# Patient Record
Sex: Male | Born: 1985 | Race: White | Hispanic: No | Marital: Married | Smoking: Former smoker
Health system: Southern US, Community
[De-identification: ages and names within clinical notes are randomized; demographics above are authoritative.]

---

## 2011-08-30 ENCOUNTER — Emergency Department: Payer: Self-pay | Admitting: Emergency Medicine

## 2012-02-26 ENCOUNTER — Emergency Department: Payer: Self-pay | Admitting: Emergency Medicine

## 2014-09-11 ENCOUNTER — Ambulatory Visit
Admission: EM | Admit: 2014-09-11 | Discharge: 2014-09-11 | Disposition: A | Discharge status: Home / Self Care | Payer: PRIVATE HEALTH INSURANCE | Attending: Family Medicine | Admitting: Family Medicine

## 2014-09-11 ENCOUNTER — Encounter: Payer: Self-pay | Admitting: Emergency Medicine

## 2014-09-11 DIAGNOSIS — S0101XA Laceration without foreign body of scalp, initial encounter: Secondary | ICD-10-CM | POA: Diagnosis not present

## 2014-09-11 MED ORDER — TETANUS-DIPHTH-ACELL PERTUSSIS 5-2.5-18.5 LF-MCG/0.5 IM SUSP
0.5000 mL | Freq: Once | INTRAMUSCULAR | Status: AC
  Administered 2014-09-11: 0.5 mL via INTRAMUSCULAR

## 2014-09-11 NOTE — ED Provider Notes (Addendum)
CSN: [id number]     Arrival date & time 09/11/14  1550 History   First MD Initiated Contact with Patient 09/11/14 1634     Chief Complaint  Patient presents with  . Laceration   (Consider location/radiation/quality/duration/timing/severity/associated sxs/prior Treatment) HPI Comments: Patient presents with a scalp laceration to left side of scalp. States sustained with the edge of a large printer. Denies any loss of consciousness, vision problems.   The history is provided by the patient.    History reviewed. No pertinent past medical history. History reviewed. No pertinent past surgical history. No family history on file. History  Substance Use Topics  . Smoking status: Former Smoker    Types: Cigarettes  . Smokeless tobacco: Never Used  . Alcohol Use: No    Review of Systems  Allergies  Review of patient's allergies indicates no known allergies.  Home Medications   Prior to Admission medications   Not on File   BP 124/87 mmHg  Pulse 81  Temp(Src) 97.6 F (36.4 C) (Oral)  Resp 16  Ht 6\' 3"  (1.905 m)  Wt 300 lb (136.079 kg)  BMI 37.50 kg/m2  SpO2 98% Physical Exam  Constitutional: He is oriented to person, place, and time. He appears well-developed and well-nourished. No distress.  Eyes: Pupils are equal, round, and reactive to light.  Neurological: He is alert and oriented to person, place, and time.  Skin: He is not diaphoretic.  Superficial 2.0cm left lateral scalp straight laceration  Nursing note and vitals reviewed.   ED Course  Procedures (including critical care time) Labs Review Labs Reviewed - No data to display  Imaging Review No results found.   MDM   1. Laceration of scalp, initial encounter   2.0cm  Plan: 1. Diagnosis reviewed with patient; informed consent obtained for laceration repair; area cleaned and prepped in sterile fashion; anesthetized with 1% lidocaine with epi; 4 simple interrupted sutures placed with 5.0 Nylon; area  dressed; patient tolerated procedure well 2.wound care instructions given 3. Patient given tetanus (tdap) without any complications 3. F/u 7 days or sooner prn   Payton Mccallumrlando Janeah Kovacich, MD 09/11/14 1738  Payton Mccallumrlando Sharlee Rufino, MD 09/11/14 [phone]

## 2014-09-11 NOTE — Discharge Instructions (Signed)

## 2014-09-11 NOTE — ED Notes (Signed)
Was moving a large printer high off the ground and dropped on the right side of his head and shoulder. This occurred around 45 mins ago. Denies pain. Did not lose consciousness and does not have a headache. No changes in vision.

## 2018-12-12 ENCOUNTER — Other Ambulatory Visit: Payer: Self-pay

## 2018-12-12 DIAGNOSIS — Z20822 Contact with and (suspected) exposure to covid-19: Secondary | ICD-10-CM

## 2018-12-13 LAB — NOVEL CORONAVIRUS, NAA: SARS-CoV-2, NAA: DETECTED — AB

## 2018-12-14 ENCOUNTER — Emergency Department: Payer: 59

## 2018-12-14 ENCOUNTER — Emergency Department
Admission: EM | Admit: 2018-12-14 | Discharge: 2018-12-14 | Disposition: A | Discharge status: Home / Self Care | Payer: 59 | Attending: Emergency Medicine | Admitting: Emergency Medicine

## 2018-12-14 ENCOUNTER — Other Ambulatory Visit: Payer: Self-pay

## 2018-12-14 DIAGNOSIS — R0602 Shortness of breath: Secondary | ICD-10-CM | POA: Diagnosis present

## 2018-12-14 DIAGNOSIS — R0600 Dyspnea, unspecified: Secondary | ICD-10-CM | POA: Insufficient documentation

## 2018-12-14 DIAGNOSIS — R05 Cough: Secondary | ICD-10-CM | POA: Insufficient documentation

## 2018-12-14 DIAGNOSIS — Z87891 Personal history of nicotine dependence: Secondary | ICD-10-CM | POA: Insufficient documentation

## 2018-12-14 LAB — BASIC METABOLIC PANEL
Anion gap: 10 (ref 5–15)
BUN: 11 mg/dL (ref 6–20)
CO2: 24 mmol/L (ref 22–32)
Calcium: 8.2 mg/dL — ABNORMAL LOW (ref 8.9–10.3)
Chloride: 103 mmol/L (ref 98–111)
Creatinine, Ser: 0.99 mg/dL (ref 0.61–1.24)
GFR calc Af Amer: >60 mL/min (ref >60)
GFR calc non Af Amer: >60 mL/min (ref >60)
Glucose, Bld: 121 mg/dL — ABNORMAL HIGH (ref 70–99)
Potassium: 3.9 mmol/L (ref 3.5–5.1)
Sodium: 137 mmol/L (ref 135–145)

## 2018-12-14 LAB — CBC
HCT: 49.2 % (ref 39.0–52.0)
Hemoglobin: 16.7 g/dL (ref 13.0–17.0)
MCH: 30.5 pg (ref 26.0–34.0)
MCHC: 33.9 g/dL (ref 30.0–36.0)
MCV: 89.9 fL (ref 80.0–100.0)
Platelets: 159 10*3/uL (ref 150–400)
RBC: 5.47 MIL/uL (ref 4.22–5.81)
RDW: 12.1 % (ref 11.5–15.5)
WBC: 6.4 10*3/uL (ref 4.0–10.5)
nRBC: 0 % (ref 0.0–0.2)

## 2018-12-14 LAB — TROPONIN I (HIGH SENSITIVITY): Troponin I (High Sensitivity): 7 ng/L (ref <18)

## 2018-12-14 LAB — LACTIC ACID, PLASMA: Lactic Acid, Venous: 1 mmol/L (ref 0.5–1.9)

## 2018-12-14 MED ORDER — HYDROCOD POLST-CPM POLST ER 10-8 MG/5ML PO SUER
5.0000 mL | Freq: Once | ORAL | Status: AC
  Administered 2018-12-14: 5 mL via ORAL
  Filled 2018-12-14: qty 5

## 2018-12-14 MED ORDER — HYDROCOD POLST-CPM POLST ER 10-8 MG/5ML PO SUER: 5.0000 mL | Freq: Two times a day (BID) | ORAL | 0 refills | Status: AC

## 2018-12-14 MED ORDER — HYDROXYCHLOROQUINE SULFATE 200 MG PO TABS: 200.0000 mg | ORAL_TABLET | Freq: Two times a day (BID) | ORAL | 0 refills | Status: AC

## 2018-12-14 MED ORDER — SODIUM CHLORIDE 0.9 % IV BOLUS
1000.0000 mL | Freq: Once | INTRAVENOUS | Status: AC
  Administered 2018-12-14: 07:00:00 1000 mL via INTRAVENOUS

## 2018-12-14 MED ORDER — HYDROXYCHLOROQUINE SULFATE 200 MG PO TABS
400.0000 mg | ORAL_TABLET | Freq: Once | ORAL | Status: AC
  Administered 2018-12-14: 11:00:00 400 mg via ORAL
  Filled 2018-12-14: qty 2

## 2018-12-14 MED ORDER — ALBUTEROL SULFATE HFA 108 (90 BASE) MCG/ACT IN AERS
2.0000 | INHALATION_SPRAY | Freq: Once | RESPIRATORY_TRACT | Status: AC
  Administered 2018-12-14: 06:00:00 2 via RESPIRATORY_TRACT
  Filled 2018-12-14: qty 6.7

## 2018-12-14 NOTE — ED Notes (Signed)
Assumed care of ptient. Patient due repeat ekg 1 hour. Disposition pending results. Patient currently sitting up in bed. Denies pain/ sob or discomforts. Vss. Will monitor.

## 2018-12-14 NOTE — ED Provider Notes (Signed)
[facility] Emergency Department Provider Note   ____________________________________________   First MD Initiated Contact with Patient 12/14/18 [phone]     (approximate)  I have reviewed the triage vital signs and the nursing notes.   HISTORY  Chief Complaint Shortness of Breath    HPI Warren Smith is a 33 y.o. male with 10 days of symptoms of tiredness and shortness of breath with intermittent bad cough.  Patient reports he is feeling a little bit better but still short of breath.  Cough is nonproductive.  He is not sure about a fever.  He tested positive for coronavirus.  Had a swab yesterday results today.  He denies any medical problems.  He used an inhaler here in the emergency room and feels better.         No past medical history on file.  There are no active problems to display for this patient.   No past surgical history on file.  Prior to Admission medications   Medication Sig Start Date End Date Taking? Authorizing Provider  hydroxychloroquine (PLAQUENIL) 200 MG tablet Take 1 tablet (200 mg total) by mouth 2 (two) times daily. 12/14/18 12/14/19  Nena Polio, MD    Allergies Patient has no known allergies.  No family history on file.  Social History Social History   Tobacco Use  . Smoking status: Former Smoker    Types: Cigarettes  . Smokeless tobacco: Never Used  Substance Use Topics  . Alcohol use: No  . Drug use: No    Review of Systems  Constitutional: No fever/chills Eyes: No visual changes. ENT: No sore throat. Cardiovascular: Denies chest pain. Respiratory:  shortness of breath. Gastrointestinal: No abdominal pain.  No nausea, no vomiting.  No diarrhea.  No constipation. Genitourinary: Negative for dysuria. Musculoskeletal: Negative for back pain. Skin: Negative for rash. Neurological: Negative for headaches, focal weakness   ____________________________________________   PHYSICAL EXAM:  VITAL SIGNS:  ED Triage Vitals [12/14/18 0459]  Enc Vitals Group     BP (!) 171/98     Pulse Rate (!) 117     Resp 20     Temp      Temp src      SpO2 92 %     Weight (!) 375 lb (170.1 kg)     Height 6\' 3"  (1.905 m)     Head Circumference      Peak Flow      Pain Score 0     Pain Loc      Pain Edu?      Excl. in [place]?     Constitutional: Alert and oriented. Well appearing and in no acute distress. Eyes: Conjunctivae are normal.  Head: Atraumatic. Nose: No congestion/rhinnorhea. Mouth/Throat: Mucous membranes are moist.  Oropharynx non-erythematous. Neck: No stridor.  Cardiovascular: Normal rate, regular rhythm. Grossly normal heart sounds.  Good peripheral circulation. Respiratory: Normal respiratory effort.  No retractions. Lungs CTAB. Gastrointestinal: Soft and nontender. No distention. No abdominal bruits. No CVA tenderness. Musculoskeletal: No lower extremity tenderness nor edema.   Neurologic:  Normal speech and language. No gross focal neurologic deficits are appreciated. N Skin:  Skin is warm, dry and intact. No rash noted.   ____________________________________________   LABS (all labs ordered are listed, but only abnormal results are displayed)  Labs Reviewed  BASIC METABOLIC PANEL - Abnormal; Notable for the following components:      Result Value   Glucose, Bld 121 (*)    Calcium 8.2 (*)  All other components within normal limits  CULTURE, BLOOD (ROUTINE X 2)  CULTURE, BLOOD (ROUTINE X 2)  CBC  LACTIC ACID, PLASMA  TROPONIN I (HIGH SENSITIVITY)   ____________________________________________  EKG  EKG read interpreted by me shows sinus tachycardia rate of 104 normal axis no acute ST-T wave changes ____________________________________________  RADIOLOGY  ED MD interpretation: X-ray read by radiology reviewed by me shows a patchy pneumonia consistent with COVID  Official radiology report(s): Dg Chest Portable 1 View  Result Date: 12/14/2018 CLINICAL DATA:   Shortness of breath EXAM: PORTABLE CHEST 1 VIEW COMPARISON:  None. FINDINGS: Patchy bilateral infiltrate. Low volume chest. No effusion or pneumothorax. Normal heart size. IMPRESSION: Patchy bilateral pneumonia correlating with history of COVID-19 infection. Electronically Signed   By: Marnee SpringJonathon  Watts M.D.   On: 12/14/2018 06:32    ____________________________________________   PROCEDURES  Procedure(s) performed (including Critical Care):  Procedures   ____________________________________________   INITIAL IMPRESSION / ASSESSMENT AND PLAN / ED COURSE  Patient is not hypoxic.  His O2 sats go down to 92% walking otherwise there are 93 9495% usually 95.  Discussed with patient whether or not he wants to try hydroxychloroquine.  He is willing to try this.  EKG does not show any QTC prolongation.  He understands that some people say with a wonder drug and other people say it does not work at all.  He understands that there is some risk with it although with a short 5-day course and normal EKG these risks are minimal.  He will return if any worsening occurs.          Clinical Course as of Dec 14 1015  Wed Dec 14, 2018  0716 nRBC: 0.0 [PM]    Clinical Course User Index [PM] Arnaldo NatalMalinda, Laisa Larrick F, MD     ____________________________________________   FINAL CLINICAL IMPRESSION(S) / ED DIAGNOSES  Final diagnoses:  Dyspnea, unspecified type     ED Discharge Orders         Ordered    hydroxychloroquine (PLAQUENIL) 200 MG tablet  2 times daily     12/14/18 1016           Note:  This document was prepared using Dragon voice recognition software and may include unintentional dictation errors.    Arnaldo NatalMalinda, Unika Nazareno F, MD 12/14/18 1018

## 2018-12-14 NOTE — ED Notes (Signed)
Per MD Cinda Quest keep pt for 2 hours and watch then repeat EKG. Pt informed and resting comfortably in bed at this time.

## 2018-12-14 NOTE — ED Notes (Signed)
Patient becoming anxious and loud in hall bed. md aware patient c/o of pain returning.

## 2018-12-14 NOTE — Discharge Instructions (Addendum)
Take the hydroxychloroquine 1 pill twice a day.  Return here for any worsening at all including worsening fever or shortness of breath chest pain or any other complaints.  Use the Tussionex 1 teaspoon or 5 cc twice a day as needed for cough.  Use the albuterol inhaler we gave you 2 puffs 4 times a day as needed as well.  If this is not working return.

## 2018-12-14 NOTE — ED Notes (Signed)
Pt ambulated in room. Pt initial O2 at 94%. Pt's O2 dropped to 93/92%. Pt stated no difficulty ambulating or breathing. Pt states he feels much better than when he first came in. Pt back in bed and resting comfortably at this time. Pt's O2 now at 96% RA.MD aware

## 2018-12-14 NOTE — ED Provider Notes (Signed)
EKG 2 hours after the Plaquenil does not show any QTC prolongation.  His QTC is 433 ms.   Nena Polio, MD 12/14/18 [phone]

## 2018-12-14 NOTE — ED Triage Notes (Signed)
Pt dx with Covid today and now co worsening shob, cough, and inability to sleep.

## 2021-04-19 IMAGING — DX PORTABLE CHEST - 1 VIEW
1 series · 1 of 1 positions shown · non-contrast
Comparison: None.

CLINICAL DATA: Shortness of breath

EXAM:
PORTABLE CHEST 1 VIEW

[chest ap]
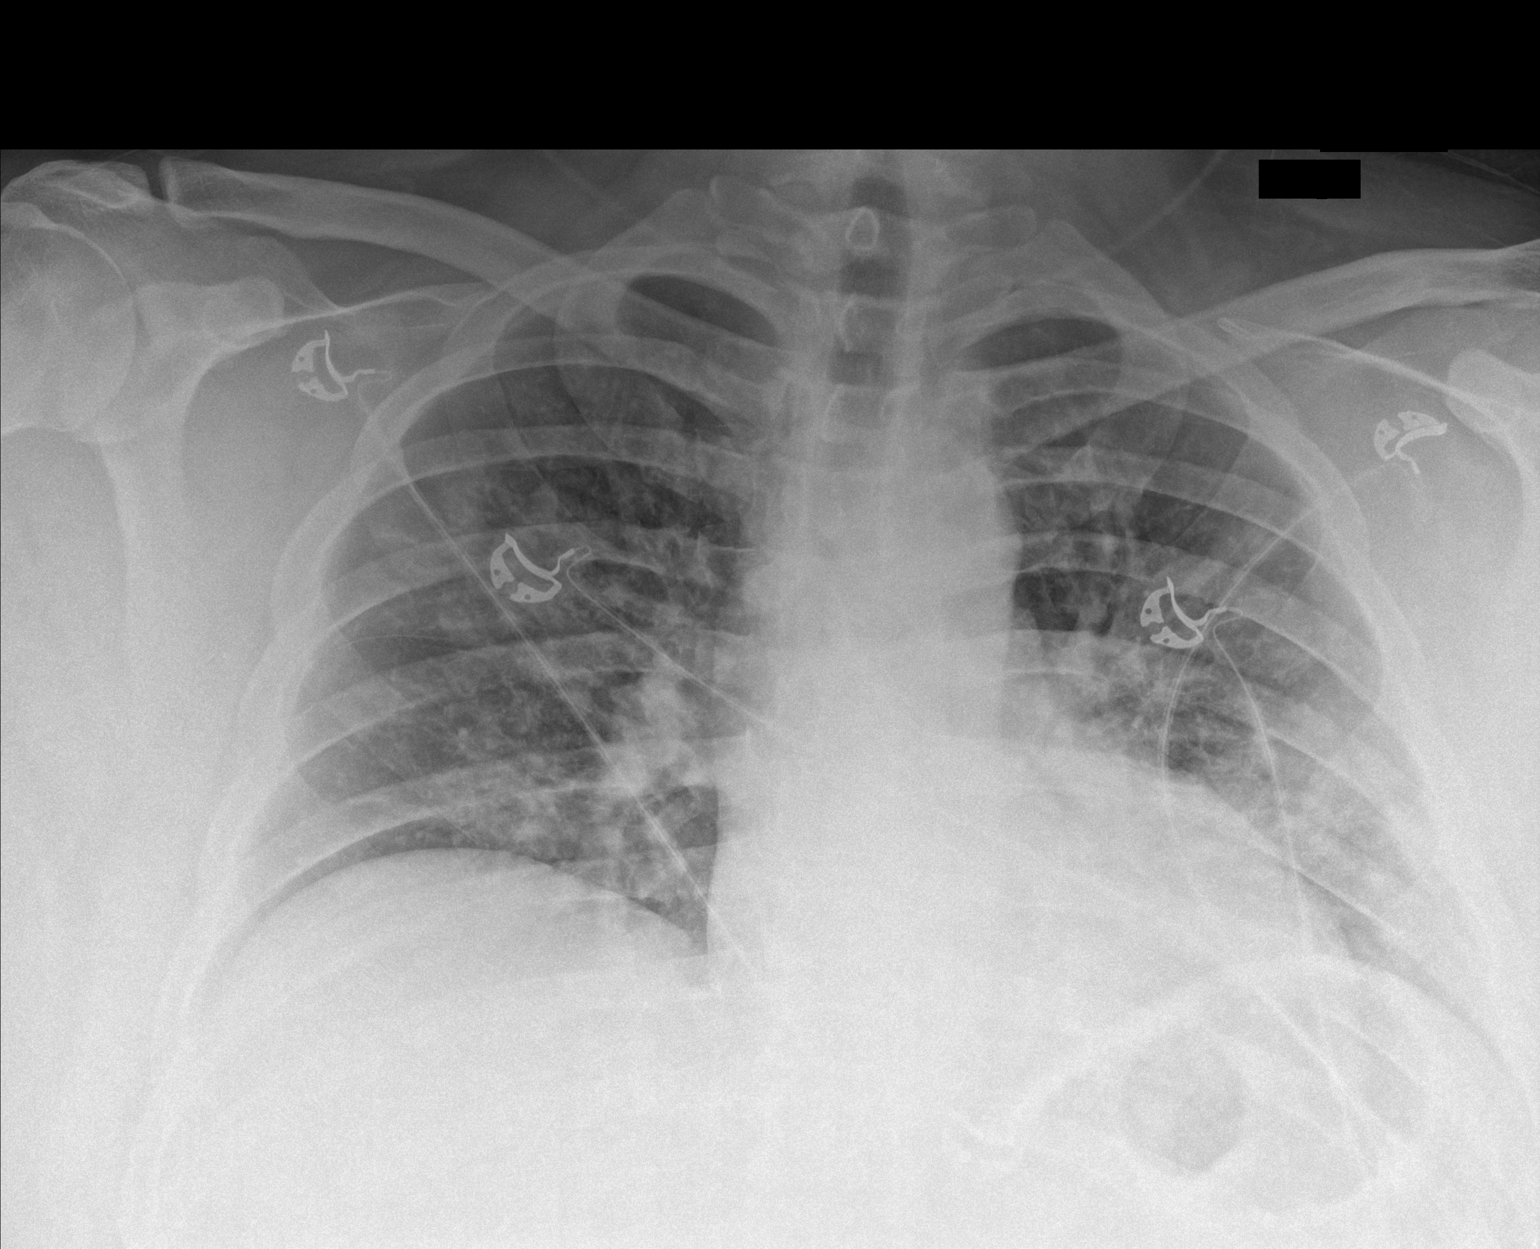

[1 of 1 positions shown; findings below may reference images not displayed]

FINDINGS: Patchy bilateral infiltrate. Low volume chest. No effusion or
pneumothorax. Normal heart size.
IMPRESSION: Patchy bilateral pneumonia correlating with history of [id number]
infection.
